# Patient Record
Sex: Female | Born: 2005 | Race: Black or African American | Hispanic: No | Marital: Single | Smoking: Never smoker
Health system: Southern US, Community
[De-identification: ages and names within clinical notes are randomized; demographics above are authoritative.]

## PROBLEM LIST (undated history)

## (undated) DIAGNOSIS — J45909 Unspecified asthma, uncomplicated: Secondary | ICD-10-CM

---
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
## 2023-01-07 ENCOUNTER — Emergency Department
Admission: EM | Admit: 2023-01-07 | Discharge: 2023-01-07 | Disposition: A | Discharge status: Home / Self Care | Payer: Self-pay | Attending: Emergency Medicine | Admitting: Emergency Medicine

## 2023-01-07 ENCOUNTER — Other Ambulatory Visit: Payer: Self-pay

## 2023-01-07 DIAGNOSIS — D72810 Lymphocytopenia: Secondary | ICD-10-CM | POA: Insufficient documentation

## 2023-01-07 DIAGNOSIS — K529 Noninfective gastroenteritis and colitis, unspecified: Secondary | ICD-10-CM | POA: Insufficient documentation

## 2023-01-07 DIAGNOSIS — Z1152 Encounter for screening for COVID-19: Secondary | ICD-10-CM | POA: Insufficient documentation

## 2023-01-07 LAB — CBC
HCT: 40.4 % (ref 36.0–46.0)
Hemoglobin: 12.6 g/dL (ref 12.0–15.0)
MCH: 27.2 pg (ref 26.0–34.0)
MCHC: 31.2 g/dL (ref 30.0–36.0)
MCV: 87.1 fL (ref 80.0–100.0)
Platelets: 346 10*3/uL (ref 150–400)
RBC: 4.64 MIL/uL (ref 3.87–5.11)
RDW: 13.1 % (ref 11.5–15.5)
WBC: 3 10*3/uL — ABNORMAL LOW (ref 4.0–10.5)
nRBC: 0 % (ref 0.0–0.2)

## 2023-01-07 LAB — COMPREHENSIVE METABOLIC PANEL
ALT: 26 U/L (ref 0–44)
AST: 27 U/L (ref 15–41)
Albumin: 4.3 g/dL (ref 3.5–5.0)
Alkaline Phosphatase: 54 U/L (ref 38–126)
Anion gap: 7 (ref 5–15)
BUN: 14 mg/dL (ref 6–20)
CO2: 23 mmol/L (ref 22–32)
Calcium: 9.3 mg/dL (ref 8.9–10.3)
Chloride: 106 mmol/L (ref 98–111)
Creatinine, Ser: 0.61 mg/dL (ref 0.44–1.00)
GFR, Estimated: >60 mL/min (ref >60)
Glucose, Bld: 87 mg/dL (ref 70–99)
Potassium: 3.5 mmol/L (ref 3.5–5.1)
Sodium: 136 mmol/L (ref 135–145)
Total Bilirubin: 0.6 mg/dL (ref 0.3–1.2)
Total Protein: 7.8 g/dL (ref 6.5–8.1)

## 2023-01-07 LAB — URINALYSIS, ROUTINE W REFLEX MICROSCOPIC
Bilirubin Urine: NEGATIVE
Glucose, UA: NEGATIVE mg/dL
Hgb urine dipstick: NEGATIVE
Ketones, ur: 20 mg/dL — AB
Leukocytes,Ua: NEGATIVE
Nitrite: NEGATIVE
Protein, ur: NEGATIVE mg/dL
Specific Gravity, Urine: 1.027 (ref 1.005–1.030)
pH: 5 (ref 5.0–8.0)

## 2023-01-07 LAB — LIPASE, BLOOD: Lipase: 28 U/L (ref 11–51)

## 2023-01-07 LAB — GROUP A STREP BY PCR: Group A Strep by PCR: NOT DETECTED

## 2023-01-07 LAB — RESP PANEL BY RT-PCR (RSV, FLU A&B, COVID)  RVPGX2
Influenza A by PCR: NEGATIVE
Influenza B by PCR: NEGATIVE
Resp Syncytial Virus by PCR: NEGATIVE
SARS Coronavirus 2 by RT PCR: NEGATIVE

## 2023-01-07 LAB — POC URINE PREG, ED: Preg Test, Ur: NEGATIVE

## 2023-01-07 MED ORDER — ONDANSETRON 4 MG PO TBDP: 4.0000 mg | ORAL_TABLET | Freq: Once | ORAL | Status: DC | PRN

## 2023-01-07 MED ORDER — DICYCLOMINE HCL 10 MG PO CAPS: 10.0000 mg | ORAL_CAPSULE | Freq: Three times a day (TID) | ORAL | 0 refills | Status: DC

## 2023-01-07 MED ORDER — ONDANSETRON 4 MG PO TBDP: 4.0000 mg | ORAL_TABLET | Freq: Three times a day (TID) | ORAL | 0 refills | Status: AC | PRN

## 2023-01-07 MED ORDER — DICYCLOMINE HCL 10 MG PO CAPS
10.0000 mg | ORAL_CAPSULE | Freq: Once | ORAL | Status: AC
  Administered 2023-01-07: 10 mg via ORAL
  Filled 2023-01-07: qty 1

## 2023-01-07 MED ORDER — ONDANSETRON HCL 4 MG/2ML IJ SOLN
4.0000 mg | Freq: Once | INTRAMUSCULAR | Status: AC
  Administered 2023-01-07: 4 mg via INTRAVENOUS
  Filled 2023-01-07: qty 2

## 2023-01-07 MED ORDER — SODIUM CHLORIDE 0.9 % IV BOLUS
1000.0000 mL | Freq: Once | INTRAVENOUS | Status: AC
  Administered 2023-01-07: 1000 mL via INTRAVENOUS

## 2023-01-07 NOTE — ED Notes (Signed)
Discharge instructions explained to patient and family at this time. Patient and family state they understand and agree.   

## 2023-01-07 NOTE — Discharge Instructions (Signed)
You can take Zofran every 8 hours for nausea and vomiting.  You can take Bentyl before meals and at bedtime for the next 5 days.

## 2023-01-07 NOTE — ED Provider Notes (Signed)
[facility] Provider Note  Patient Contact: 7:46 PM (approximate)   History   Abdominal Pain   HPI  Tonya Cook is a 17 y.o. female with an unremarkable past medical history, presents to the emergency department with intermittent vomiting and diarrhea over the past 5 days as well as cramping.  Patient denies specific abdominal pain.  No fever.  No chest pain, chest tightness or shortness of breath.      Physical Exam   Triage Vital Signs: ED Triage Vitals [01/07/23 1856]  Enc Vitals Group     BP 129/83     Pulse Rate 88     Resp 18     Temp 97.9 F (36.6 C)     Temp Source Oral     SpO2 100 %     Weight 120 lb (54.4 kg)     Height 5\' 4"  (1.626 m)     Head Circumference      Peak Flow      Pain Score 8     Pain Loc      Pain Edu?      Excl. in [place]?     Most recent vital signs: Vitals:   01/07/23 1856  BP: 129/83  Pulse: 88  Resp: 18  Temp: 97.9 F (36.6 C)  SpO2: 100%    Constitutional: Alert and oriented. Patient is lying supine. Eyes: Conjunctivae are normal. PERRL. EOMI. Head: Atraumatic. ENT:      Ears: Tympanic membranes are mildly injected with mild effusion bilaterally.       Nose: No congestion/rhinnorhea.      Mouth/Throat: Mucous membranes are moist. Posterior pharynx is mildly erythematous.  Hematological/Lymphatic/Immunilogical: No cervical lymphadenopathy.  Cardiovascular: Normal rate, regular rhythm. Normal S1 and S2.  Good peripheral circulation. Respiratory: Normal respiratory effort without tachypnea or retractions. Lungs CTAB. Good air entry to the bases with no decreased or absent breath sounds. Gastrointestinal: Bowel sounds 4 quadrants. Soft and nontender to palpation. No guarding or rigidity. No palpable masses. No distention. No CVA tenderness. Musculoskeletal: Full range of motion to all extremities. No gross deformities appreciated. Neurologic:  Normal speech and language. No gross focal neurologic  deficits are appreciated.  Skin:  Skin is warm, dry and intact. No rash noted. Psychiatric: Mood and affect are normal. Speech and behavior are normal. Patient exhibits appropriate insight and judgement.   ED Results / Procedures / Treatments   Labs (all labs ordered are listed, but only abnormal results are displayed) Labs Reviewed  CBC - Abnormal; Notable for the following components:      Result Value   WBC 3.0 (*)    All other components within normal limits  URINALYSIS, ROUTINE W REFLEX MICROSCOPIC - Abnormal; Notable for the following components:   Color, Urine YELLOW (*)    APPearance HAZY (*)    Ketones, ur 20 (*)    All other components within normal limits  GROUP A STREP BY PCR  RESP PANEL BY RT-PCR (RSV, FLU A&B, COVID)  RVPGX2  LIPASE, BLOOD  COMPREHENSIVE METABOLIC PANEL  POC URINE PREG, ED          PROCEDURES:  Critical Care performed: No  Procedures   MEDICATIONS ORDERED IN ED: Medications  ondansetron (ZOFRAN-ODT) disintegrating tablet 4 mg (has no administration in time range)  sodium chloride 0.9 % bolus 1,000 mL (1,000 mLs Intravenous New Bag/Given 01/07/23 2002)  ondansetron (ZOFRAN) injection 4 mg (4 mg Intravenous Given 01/07/23 2002)  dicyclomine (BENTYL) capsule 10  mg (10 mg Oral Given 01/07/23 1955)     IMPRESSION / MDM / ASSESSMENT AND PLAN / ED COURSE  I reviewed the triage vital signs and the nursing notes.                              Assessment and plan: Vomiting: Diarrhea:   17 year old female presents to the emergency department with vomiting and diarrhea that has occurred on and off for the past 5 days.  Vital signs are reassuring at triage.  On exam, patient was alert and nontoxic-appearing.  Urine pregnancy test was negative.  Group A strep testing was negative.  COVID, and flu and RSV testing was negative.  Urinalysis shows no signs of UTI.  CBC indicates lymphopenia.  CMP within reference range.  Suspect unspecified  gastroenteritis.  Patient was given normal saline bolus, Zofran and Bentyl in the emergency department and felt overall improved.  She was discharged with Zofran and Bentyl.  Return precautions were given to return with new or worsening symptoms.   FINAL CLINICAL IMPRESSION(S) / ED DIAGNOSES   Final diagnoses:  Noninfectious gastroenteritis, unspecified type     Rx / DC Orders   ED Discharge Orders          Ordered    ondansetron (ZOFRAN-ODT) 4 MG disintegrating tablet  Every 8 hours PRN        01/07/23 2116    dicyclomine (BENTYL) 10 MG capsule  3 times daily before meals & bedtime        01/07/23 2116             Note:  This document was prepared using Dragon voice recognition software and may include unintentional dictation errors.   Vallarie Mare Startex, Hershal Coria 01/07/23 2120    Harvest Dark, MD 01/07/23 2245

## 2023-01-07 NOTE — ED Triage Notes (Signed)
Pt with c/o generalized abdominal pain x 2 weeks. Pt with c/o N, V,D, denies urinary symptoms. Pt states symptoms come and go.

## 2024-05-03 NOTE — Progress Notes (Deleted)
 GYNECOLOGY ANNUAL PHYSICAL EXAM PROGRESS NOTE  Subjective:    Tonya Cook is a 18 y.o. No obstetric history on file. female who presents for an annual exam.  The patient {is/is not/has never been:13135} sexually active. The patient participates in regular exercise: {yes/no/not asked:9010}. Has the patient ever been transfused or tattooed?: {yes/no/not asked:9010}. The patient reports that there {is/is not:9024} domestic violence in her life.   The patient has the following complaints today:   Menstrual History: Menarche age: *** No LMP recorded.     Gynecologic History:  Contraception: {method:5051} History of STI's:  Last Pap: Not age appropriate Last mammogram: Not age appropriate      OB History  No obstetric history on file.    No past medical history on file.  No past surgical history on file.  No family history on file.  Social History   Socioeconomic History   Marital status: Single    Spouse name: Not on file   Number of children: Not on file   Years of education: Not on file   Highest education level: Not on file  Occupational History   Not on file  Tobacco Use   Smoking status: Never   Smokeless tobacco: Never  Substance and Sexual Activity   Alcohol use: Not on file   Drug use: Not on file   Sexual activity: Not on file  Other Topics Concern   Not on file  Social History Narrative   Not on file   Social Drivers of Health   Financial Resource Strain: Not on file  Food Insecurity: Not on file  Transportation Needs: Not on file  Physical Activity: Not on file  Stress: Not on file  Social Connections: Not on file  Intimate Partner Violence: Not on file    Current Outpatient Medications on File Prior to Visit  Medication Sig Dispense Refill   dicyclomine  (BENTYL ) 10 MG capsule Take 1 capsule (10 mg total) by mouth 4 (four) times daily -  before meals and at bedtime for 5 days. 20 capsule 0   No current facility-administered  medications on file prior to visit.    No Known Allergies   Review of Systems Constitutional: negative for chills, fatigue, fevers and sweats Eyes: negative for irritation, redness and visual disturbance Ears, nose, mouth, throat, and face: negative for hearing loss, nasal congestion, snoring and tinnitus Respiratory: negative for asthma, cough, sputum Cardiovascular: negative for chest pain, dyspnea, exertional chest pressure/discomfort, irregular heart beat, palpitations and syncope Gastrointestinal: negative for abdominal pain, change in bowel habits, nausea and vomiting Genitourinary: negative for abnormal menstrual periods, genital lesions, sexual problems and vaginal discharge, dysuria and urinary incontinence Integument/breast: negative for breast lump, breast tenderness and nipple discharge Hematologic/lymphatic: negative for bleeding and easy bruising Musculoskeletal:negative for back pain and muscle weakness Neurological: negative for dizziness, headaches, vertigo and weakness Endocrine: negative for diabetic symptoms including polydipsia, polyuria and skin dryness Allergic/Immunologic: negative for hay fever and urticaria      Objective:  There were no vitals taken for this visit. There is no height or weight on file to calculate BMI.    General Appearance:    Alert, cooperative, no distress, appears stated age  Head:    Normocephalic, without obvious abnormality, atraumatic  Eyes:    PERRL, conjunctiva/corneas clear, EOM's intact, both eyes  Ears:    Normal external ear canals, both ears  Nose:   Nares normal, septum midline, mucosa normal, no drainage or sinus tenderness  Throat:  Lips, mucosa, and tongue normal; teeth and gums normal  Neck:   Supple, symmetrical, trachea midline, no adenopathy; thyroid: no enlargement/tenderness/nodules; no carotid bruit or JVD  Back:     Symmetric, no curvature, ROM normal, no CVA tenderness  Lungs:     Clear to auscultation  bilaterally, respirations unlabored  Chest Wall:    No tenderness or deformity   Heart:    Regular rate and rhythm, S1 and S2 normal, no murmur, rub or gallop  Breast Exam:    No tenderness, masses, or nipple abnormality  Abdomen:     Soft, non-tender, bowel sounds active all four quadrants, no masses, no organomegaly.    Genitalia:    Pelvic:external genitalia normal, vagina without lesions, discharge, or tenderness, rectovaginal septum  normal. Cervix normal in appearance, no cervical motion tenderness, no adnexal masses or tenderness.  Uterus normal size, shape, mobile, regular contours, nontender.  Rectal:    Normal external sphincter.  No hemorrhoids appreciated. Internal exam not done.   Extremities:   Extremities normal, atraumatic, no cyanosis or edema  Pulses:   2+ and symmetric all extremities  Skin:   Skin color, texture, turgor normal, no rashes or lesions  Lymph nodes:   Cervical, supraclavicular, and axillary nodes normal  Neurologic:   CNII-XII intact, normal strength, sensation and reflexes throughout   .  Labs:  Lab Results  Component Value Date   WBC 3.0 (L) 01/07/2023   HGB 12.6 01/07/2023   HCT 40.4 01/07/2023   MCV 87.1 01/07/2023   PLT 346 01/07/2023    Lab Results  Component Value Date   CREATININE 0.61 01/07/2023   BUN 14 01/07/2023   NA 136 01/07/2023   K 3.5 01/07/2023   CL 106 01/07/2023   CO2 23 01/07/2023    Lab Results  Component Value Date   ALT 26 01/07/2023   AST 27 01/07/2023   ALKPHOS 54 01/07/2023   BILITOT 0.6 01/07/2023    No results found for: "TSH"   Assessment:   No diagnosis found.   Plan:  Blood tests: {blood tests:13147}. Breast self exam technique reviewed and patient encouraged to perform self-exam monthly. Contraception: {contraceptive methods:5051}. Discussed healthy lifestyle modifications. Mammogram {discussed/ordered:14545} Pap smear {discussed/ordered:14545}. Flu vaccine: Follow up in 1 year for annual  exam   Lendon Queen, CMA [facility]

## 2024-05-03 NOTE — Patient Instructions (Incomplete)
Preventive Care 50-20 Years Old, Female Preventive care refers to lifestyle choices and visits with your health care provider that can promote health and wellness. At this stage in your life, you may start seeing a primary care physician instead of a pediatrician for your preventive care. Preventive care visits are also called wellness exams. What can I expect for my preventive care visit? Counseling During your preventive care visit, your health care provider may ask about your: Medical history, including: Past medical problems. Family medical history. Pregnancy history. Current health, including: Menstrual cycle. Method of birth control. Emotional well-being. Home life and relationship well-being. Sexual activity and sexual health. Lifestyle, including: Alcohol, nicotine or tobacco, and drug use. Access to firearms. Diet, exercise, and sleep habits. Sunscreen use. Motor vehicle safety. Physical exam Your health care provider may check your: Height and weight. These may be used to calculate your BMI (body mass index). BMI is a measurement that tells if you are at a healthy weight. Waist circumference. This measures the distance around your waistline. This measurement also tells if you are at a healthy weight and may help predict your risk of certain diseases, such as type 2 diabetes and high blood pressure. Heart rate and blood pressure. Body temperature. Skin for abnormal spots. Breasts. What immunizations do I need?  Vaccines are usually given at various ages, according to a schedule. Your health care provider will recommend vaccines for you based on your age, medical history, and lifestyle or other factors, such as travel or where you work. What tests do I need? Screening Your health care provider may recommend screening tests for certain conditions. This may include: Vision and hearing tests. Lipid and cholesterol levels. Pelvic exam and Pap test. Hepatitis B  test. Hepatitis C test. HIV (human immunodeficiency virus) test. STI (sexually transmitted infection) testing, if you are at risk. Tuberculosis skin test if you have symptoms. BRCA-related cancer screening. This may be done if you have a family history of breast, ovarian, tubal, or peritoneal cancers. Talk with your health care provider about your test results, treatment options, and if necessary, the need for more tests. Follow these instructions at home: Eating and drinking  Eat a healthy diet that includes fresh fruits and vegetables, whole grains, lean protein, and low-fat dairy products. Drink enough fluid to keep your urine pale yellow. Do not drink alcohol if: Your health care provider tells you not to drink. You are pregnant, may be pregnant, or are planning to become pregnant. You are under the legal drinking age. In the [place] the legal drinking age is 67. If you drink alcohol: Limit how much you have to 0-1 drink a day. Know how much alcohol is in your drink. In the [place] one drink equals one 12 oz bottle of beer (355 mL), one 5 oz glass of wine (148 mL), or one 1 oz glass of hard liquor (44 mL). Lifestyle Brush your teeth every morning and night with fluoride toothpaste. Floss one time each day. Exercise for at least 30 minutes 5 or more days of the week. Do not use any products that contain nicotine or tobacco. These products include cigarettes, chewing tobacco, and vaping devices, such as e-cigarettes. If you need help quitting, ask your health care provider. Do not use drugs. If you are sexually active, practice safe sex. Use a condom or other form of protection to prevent STIs. If you do not wish to become pregnant, use a form of birth control. If you plan to become pregnant,  see your health care provider for a prepregnancy visit. Find healthy ways to manage stress, such as: Meditation, yoga, or listening to music. Journaling. Talking to a trusted person. Spending time  with friends and family. Safety Always wear your seat belt while driving or riding in a vehicle. Do not drive: If you have been drinking alcohol. Do not ride with someone who has been drinking. When you are tired or distracted. While texting. If you have been using any mind-altering substances or drugs. Wear a helmet and other protective equipment during sports activities. If you have firearms in your house, make sure you follow all gun safety procedures. Seek help if you have been bullied, physically abused, or sexually abused. Use the internet responsibly to avoid dangers, such as online bullying and online sex predators. What's next? Go to your health care provider once a year for an annual wellness visit. Ask your health care provider how often you should have your eyes and teeth checked. Stay up to date on all vaccines. This information is not intended to replace advice given to you by your health care provider. Make sure you discuss any questions you have with your health care provider. Document Revised: 05/14/2021 Document Reviewed: 05/14/2021 Elsevier Patient Education  2024 [employer] Breast Self-Awareness Breast self-awareness is knowing how your breasts look and feel. You need to: Check your breasts on a regular basis. Tell your doctor about any changes. Become familiar with the look and feel of your breasts. This can help you catch a breast problem while it is still small and can be treated. You should do breast self-exams even if you have breast implants. What you need: A mirror. A well-lit room. A pillow or other soft object. How to do a breast self-exam Follow these steps to do a breast self-exam: Look for changes  Take off all the clothes above your waist. Stand in front of a mirror in a room with good lighting. Put your hands down at your sides. Compare your breasts in the mirror. Look for any difference between them, such as: A difference in shape. A  difference in size. Wrinkles, dips, and bumps in one breast and not the other. Look at each breast for changes in the skin, such as: Redness. Scaly areas. Skin that has gotten thicker. Dimpling. Open sores (ulcers). Look for changes in your nipples, such as: Fluid coming out of a nipple. Fluid around a nipple. Bleeding. Dimpling. Redness. A nipple that looks pushed in (retracted), or that has changed position. Feel for changes Lie on your back. Feel each breast. To do this: Pick a breast to feel. Place a pillow under the shoulder closest to that breast. Put the arm closest to that breast behind your head. Feel the nipple area of that breast using the hand of your other arm. Feel the area with the pads of your three middle fingers by making small circles with your fingers. Use light, medium, and firm pressure. Continue the overlapping circles, moving downward over the breast. Keep making circles with your fingers. Stop when you feel your ribs. Start making circles with your fingers again, this time going upward until you reach your collarbone. Then, make circles outward across your breast and into your armpit area. Squeeze your nipple. Check for discharge and lumps. Repeat these steps to check your other breast. Sit or stand in the tub or shower. With soapy water on your skin, feel each breast the same way you did when you were lying down.  Write down what you find Writing down what you find can help you remember what to tell your doctor. Write down: What is normal for each breast. Any changes you find in each breast. These include: The kind of changes you find. A tender or painful breast. Any lump you find. Write down its size and where it is. When you last had your monthly period (menstrual cycle). General tips If you are breastfeeding, the best time to check your breasts is after you feed your baby or after you use a breast pump. If you get monthly bleeding, the best time to  check your breasts is 5-7 days after your monthly cycle ends. With time, you will become comfortable with the self-exam. You will also start to know if there are changes in your breasts. Contact a doctor if: You see a change in the shape or size of your breasts or nipples. You see a change in the skin of your breast or nipples, such as red or scaly skin. You have fluid coming from your nipples that is not normal. You find a new lump or thick area. You have breast pain. You have any concerns about your breast health. Summary Breast self-awareness includes looking for changes in your breasts and feeling for changes within your breasts. You should do breast self-awareness in front of a mirror in a well-lit room. If you get monthly periods (menstrual cycles), the best time to check your breasts is 5-7 days after your period ends. Tell your doctor about any changes you see in your breasts. Changes include changes in size, changes on the skin, painful or tender breasts, or fluid from your nipples that is not normal. This information is not intended to replace advice given to you by your health care provider. Make sure you discuss any questions you have with your health care provider. Document Revised: 04/23/2022 Document Reviewed: 09/18/2021 Elsevier Patient Education  2024 [employer].

## 2024-05-04 ENCOUNTER — Encounter: Payer: Self-pay | Admitting: Certified Nurse Midwife

## 2024-05-04 DIAGNOSIS — Z1159 Encounter for screening for other viral diseases: Secondary | ICD-10-CM

## 2024-05-04 DIAGNOSIS — Z114 Encounter for screening for human immunodeficiency virus [HIV]: Secondary | ICD-10-CM

## 2024-05-04 DIAGNOSIS — Z01419 Encounter for gynecological examination (general) (routine) without abnormal findings: Secondary | ICD-10-CM

## 2024-05-08 ENCOUNTER — Encounter: Payer: Self-pay | Admitting: Certified Nurse Midwife

## 2024-07-19 ENCOUNTER — Encounter: Payer: Self-pay | Admitting: Family Medicine

## 2024-07-19 ENCOUNTER — Ambulatory Visit: Payer: Self-pay | Admitting: Family Medicine

## 2024-07-19 VITALS — BP 105/58 | HR 71 | Resp 16 | Ht 64.0 in | Wt 120.0 lb

## 2024-07-19 DIAGNOSIS — N946 Dysmenorrhea, unspecified: Secondary | ICD-10-CM | POA: Insufficient documentation

## 2024-07-19 DIAGNOSIS — Z793 Long term (current) use of hormonal contraceptives: Secondary | ICD-10-CM

## 2024-07-19 DIAGNOSIS — F4323 Adjustment disorder with mixed anxiety and depressed mood: Secondary | ICD-10-CM | POA: Diagnosis not present

## 2024-07-19 DIAGNOSIS — F5102 Adjustment insomnia: Secondary | ICD-10-CM | POA: Insufficient documentation

## 2024-07-19 DIAGNOSIS — J4520 Mild intermittent asthma, uncomplicated: Secondary | ICD-10-CM

## 2024-07-19 LAB — POCT URINE PREGNANCY: Preg Test, Ur: NEGATIVE

## 2024-07-19 MED ORDER — HYDROXYZINE PAMOATE 25 MG PO CAPS: 25.0000 mg | ORAL_CAPSULE | Freq: Every evening | ORAL | 2 refills | Status: AC | PRN

## 2024-07-19 MED ORDER — NORETHIN ACE-ETH ESTRAD-FE 1-20 MG-MCG PO TABS: 1.0000 | ORAL_TABLET | Freq: Every day | ORAL | 3 refills | Status: DC

## 2024-07-19 MED ORDER — AIRSUPRA 90-80 MCG/ACT IN AERO: 2.0000 | INHALATION_SPRAY | RESPIRATORY_TRACT | 3 refills | Status: AC | PRN

## 2024-07-19 NOTE — Progress Notes (Signed)
 New patient visit   Patient: Tonya Cook   DOB: 10-Dec-2005   18 y.o. Female  MRN: [id number] Visit Date: 07/19/2024  Today's healthcare provider: Rockie Agent, MD   Chief Complaint  Patient presents with   New Patient (Initial Visit)    NP/Est Care pt wants to discuss BC( Pill)    Subjective    Tonya Cook is a 18 y.o. female who presents today as a new patient to establish care.   HPI     New Patient (Initial Visit)    Additional comments: NP/Est Care pt wants to discuss BC( Pill)       Last edited by Marylen Odella CROME, CMA on 07/19/2024 10:38 AM.       Discussed the use of AI scribe software for clinical note transcription with the patient, who gave verbal consent to proceed.  History of Present Illness Tonya Cook is a 18 year old female who presents to establish care and discuss starting oral contraception.  She is interested in starting oral contraception to manage painful and heavy periods, hormonal acne, and low energy levels associated with her menstrual cycle. Her periods are regular but have always been painful. She has never been on birth control pills before but is open to starting Loestrin.  She has a history of mild, chronic asthma, which was more severe during her childhood. Currently, her asthma symptoms are more seasonal, particularly in the spring. She last used her albuterol inhaler during the spring and does not have enough albuterol. She is not currently using any other asthma medications.  She has a history of atopic rhinitis, experiencing frequent skin rashes, particularly in the summer, for which she was previously prescribed steroids. She is not currently taking any medication for this condition.  She has been experiencing symptoms of depression and anxiety, including trouble sleeping, lack of motivation, and low energy. Her mother has a history of depression and anxiety. She has not been diagnosed with depression or anxiety before  and has not worked with a Environmental consultant.  She is sexually active with males and uses barrier contraception consistently. 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 She lives with her mother and has two younger and two older siblings.      Past Medical History:  Diagnosis Date   Iliotibial band syndrome 07/29/2015    Outpatient Medications Prior to Visit  Medication Sig   albuterol (VENTOLIN HFA) 108 (90 Base) MCG/ACT inhaler Inhale 2 puffs into the lungs.   [DISCONTINUED] norethindrone-ethinyl estradiol-FE (LOESTRIN FE) 1-20 MG-MCG tablet Take 1 tablet by mouth.   [DISCONTINUED] dicyclomine  (BENTYL ) 10 MG capsule Take 1 capsule (10 mg total) by mouth 4 (four) times daily -  before meals and at bedtime for 5 days. (Patient not taking: Reported on 07/19/2024)   No facility-administered medications prior to visit.    History reviewed. No pertinent surgical history. No family status information on file.   History reviewed. No pertinent family history. Social History   Socioeconomic History   Marital status: Single    Spouse name: Not on file   Number of children: Not on file   Years of education: Not on file   Highest education level: Not on file  Occupational History   Not on file  Tobacco Use   Smoking status: Never   Smokeless tobacco: Never  Substance and Sexual  Activity   Alcohol use: Not on file   Drug use: Not on file   Sexual activity: Not on file  Other Topics Concern   Not on file  Social History Narrative   Not on file   Social Drivers of Health   Financial Resource Strain: Not on file  Food Insecurity: Not on file  Transportation Needs: Not on file  Physical Activity: Not on file  Stress: Not on file  Social Connections: Not on file     No Known Allergies   There is no immunization history on file for this  patient.  Health Maintenance  Topic Date Due   HPV VACCINES (1 - 3-dose series) Never done   HIV Screening  Never done   Meningococcal B Vaccine (1 of 2 - Standard) Never done   Hepatitis C Screening  Never done   DTaP/Tdap/Td (1 - Tdap) Never done   Pneumococcal Vaccine (1 of 2 - PCV) 12/11/2022   COVID-19 Vaccine (3 - 2024-25 season) 08/01/2023   INFLUENZA VACCINE  06/30/2024   Hepatitis B Vaccines 19-59 Average Risk  Completed    Patient Care Team: Sharma Coyer, MD as PCP - General (Family Medicine)  Review of Systems  Last CBC Lab Results  Component Value Date   WBC 3.0 (L) 01/07/2023   HGB 12.6 01/07/2023   HCT 40.4 01/07/2023   MCV 87.1 01/07/2023   MCH 27.2 01/07/2023   RDW 13.1 01/07/2023   PLT 346 01/07/2023   Last metabolic panel Lab Results  Component Value Date   GLUCOSE 87 01/07/2023   NA 136 01/07/2023   K 3.5 01/07/2023   CL 106 01/07/2023   CO2 23 01/07/2023   BUN 14 01/07/2023   CREATININE 0.61 01/07/2023   GFRNONAA >60 01/07/2023   CALCIUM 9.3 01/07/2023   PROT 7.8 01/07/2023   ALBUMIN 4.3 01/07/2023   BILITOT 0.6 01/07/2023   ALKPHOS 54 01/07/2023   AST 27 01/07/2023   ALT 26 01/07/2023   ANIONGAP 7 01/07/2023   Last lipids No results found for: CHOL, HDL, LDLCALC, LDLDIRECT, TRIG, CHOLHDL Last hemoglobin A1c No results found for: HGBA1C Last thyroid functions No results found for: TSH, T3TOTAL, T4TOTAL, THYROIDAB Last vitamin D No results found for: 25OHVITD2, 25OHVITD3, VD25OH Last vitamin B12 and Folate No results found for: VITAMINB12, FOLATE   Flowsheet Row Office Visit from 07/19/2024 in [facility]  PHQ-9 Total Score 15      07/19/2024   10:47 AM  GAD 7 : Generalized Anxiety Score  Nervous, Anxious, on Edge 1  Control/stop worrying 1  Worry too much - different things 2  Trouble relaxing 3  Restless 1  Easily annoyed or irritable 2  Afraid - awful  might happen 2  Total GAD 7 Score 12  Anxiety Difficulty Very difficult        Objective    BP (!) 105/58 (BP Location: Right Arm, Patient Position: Sitting, Cuff Size: Small)   Pulse 71   Resp 16   Ht 5' 4 (1.626 m)   Wt 120 lb (54.4 kg)   SpO2 100%   BMI 20.60 kg/m  BP Readings from Last 3 Encounters:  07/19/24 (!) 105/58  01/07/23 122/80   Wt Readings from Last 3 Encounters:  07/19/24 120 lb (54.4 kg)  01/07/23 120 lb (54.4 kg) (37%, Z= -0.34)*   * Growth percentiles are based on CDC (Girls, 2-20 Years) data.        Depression Screen  07/19/2024   10:46 AM  PHQ 2/9 Scores  PHQ - 2 Score 2  PHQ- 9 Score 15   No results found for any visits on 07/19/24.   Physical Exam Vitals reviewed.  Constitutional:      General: She is not in acute distress.    Appearance: Normal appearance. She is normal weight. She is not ill-appearing, toxic-appearing or diaphoretic.     Comments: Well groomed, calmly sitting   Cardiovascular:     Rate and Rhythm: Normal rate and regular rhythm.  Pulmonary:     Effort: Pulmonary effort is normal. No respiratory distress.     Breath sounds: No wheezing, rhonchi or rales.  Neurological:     Mental Status: She is alert and oriented to person, place, and time.  Psychiatric:        Attention and Perception: Attention and perception normal. She is attentive. She does not perceive auditory or visual hallucinations.        Mood and Affect: Mood and affect normal.        Speech: Speech normal.        Behavior: Behavior normal. Behavior is cooperative.        Thought Content: Thought content normal. Thought content is not paranoid or delusional. Thought content does not include homicidal or suicidal ideation. Thought content does not include homicidal or suicidal plan.        Judgment: Judgment normal.    Physical Exam        Assessment & Plan      Problem List Items Addressed This Visit       Respiratory   Asthma -  Primary   Asthma, mild, chronic, uncomplicated Asthma is mild, chronic, and uncomplicated, with symptoms primarily during the spring. Previously used a nebulizer, currently uses an albuterol inhaler but does not have enough albuterol at present. Airsupra , containing albuterol and a steroid, is recommended for dual action to dilate airways and reduce inflammation. - Prescribe Airsupra  (albuterol and steroid) 90-80 micrograms, two puffs as needed, no more than six doses per day - Provide coupon code for Airsupra  - Instruct to rinse mouth after use due to steroid content      Relevant Medications   albuterol (VENTOLIN HFA) 108 (90 Base) MCG/ACT inhaler   Albuterol-Budesonide (AIRSUPRA ) 90-80 MCG/ACT AERO     Genitourinary   Dysmenorrhea treated with oral contraceptive   Initiation of oral contraception for dysmenorrhea and hormonal acne Interested in starting oral contraception primarily for dysmenorrhea and hormonal acne. Periods are regular but painful and heavy, with hormonal acne and low energy around her menstrual cycle. Has not been on birth control pills before. Chosen oral contraceptive contains methenamine, ethanol estradiol, and iron, which may help alleviate her symptoms. Discussed potential side effects including risk of blood clots, mood changes, nausea, and weight gain. - Perform pregnancy test - Prescribe Loestrin (methenamine, ethanol estradiol, and iron) - Advise to take ibuprofen 400-800 mg every 4-6 hours as needed for cramps      Relevant Medications   norethindrone-ethinyl estradiol-FE (LOESTRIN FE) 1-20 MG-MCG tablet   Other Relevant Orders   POCT urine pregnancy     Other   Adjustment insomnia   Chronic  Associated with mixed anxiety and depression  Trial of hydroxyzine  25mg  PRN at bedtime       Relevant Medications   hydrOXYzine  (VISTARIL ) 25 MG capsule   Adjustment disorder with mixed anxiety and depressed mood   Depressed mood and adjustment disorder with  anxiety with  associated insomnia Chronic  Elevated depression and anxiety screening scores. Reports trouble sleeping, lack of motivation, and passive suicidal ideation. Family history of depression and anxiety. Interested in therapy but hesitant to start. Not ready for medication for depression or anxiety at this time. Discussed the importance of therapy and provided resources for finding a therapist. Offered medication for sleep. - Encourage establishing care with a therapist, using psychologytoday.com - Provide information for development of active suidal ideations with plan - Prescribe hydroxyzine  25 mg at bedtime as needed for sleep - Schedule follow-up appointment in six weeks to assess anxiety and depression        Assessment & Plan        Return in about 6 weeks (around 08/30/2024) for Anxiety, Depression.      Rockie Agent, MD  [facility] [phone] (phone) [phone] (fax)  [facility]

## 2024-07-19 NOTE — Assessment & Plan Note (Signed)
 Initiation of oral contraception for dysmenorrhea and hormonal acne Interested in starting oral contraception primarily for dysmenorrhea and hormonal acne. Periods are regular but painful and heavy, with hormonal acne and low energy around her menstrual cycle. Has not been on birth control pills before. Chosen oral contraceptive contains methenamine, ethanol estradiol, and iron, which may help alleviate her symptoms. Discussed potential side effects including risk of blood clots, mood changes, nausea, and weight gain. - Perform pregnancy test - Prescribe Loestrin (methenamine, ethanol estradiol, and iron) - Advise to take ibuprofen 400-800 mg every 4-6 hours as needed for cramps

## 2024-07-19 NOTE — Assessment & Plan Note (Addendum)
 Depressed mood and adjustment disorder with anxiety with associated insomnia Chronic  Elevated depression and anxiety screening scores. Reports trouble sleeping, lack of motivation, and passive suicidal ideation. Family history of depression and anxiety. Interested in therapy but hesitant to start. Not ready for medication for depression or anxiety at this time. Discussed the importance of therapy and provided resources for finding a therapist. Offered medication for sleep. - Encourage establishing care with a therapist, using psychologytoday.com - Provide information for development of active suidal ideations with plan - Prescribe hydroxyzine  25 mg at bedtime as needed for sleep - Schedule follow-up appointment in six weeks to assess anxiety and depression

## 2024-07-19 NOTE — Patient Instructions (Addendum)
   If you are feeling suicidal or depression symptoms worsen please immediately go to:   If you are thinking about harming yourself or having thoughts of suicide, or if you know someone who is, seek help right away. If you are in crisis, make sure you are not left alone.  If someone else is in crisis, make sure he/she/they is not left alone  Call 988 OR 1-800-273-TALK     Other crisis resources:  [facility] [employer] (Domestic Violence, Rape & Victim Assistance [phone]  [facility] (24/7)   [phone]  [place]  National Suicide Hotline   985-732-9773 (TALK)                             Contains text generated by [employer].                                 Contains text generated by [employer].

## 2024-07-19 NOTE — Assessment & Plan Note (Signed)
 Asthma, mild, chronic, uncomplicated Asthma is mild, chronic, and uncomplicated, with symptoms primarily during the spring. Previously used a nebulizer, currently uses an albuterol inhaler but does not have enough albuterol at present. Airsupra , containing albuterol and a steroid, is recommended for dual action to dilate airways and reduce inflammation. - Prescribe Airsupra  (albuterol and steroid) 90-80 micrograms, two puffs as needed, no more than six doses per day - Provide coupon code for Airsupra  - Instruct to rinse mouth after use due to steroid content

## 2024-07-19 NOTE — Assessment & Plan Note (Signed)
 Chronic  Associated with mixed anxiety and depression  Trial of hydroxyzine  25mg  PRN at bedtime

## 2024-08-11 ENCOUNTER — Ambulatory Visit
Admission: EM | Admit: 2024-08-11 | Discharge: 2024-08-11 | Disposition: A | Discharge status: Home / Self Care | Attending: Emergency Medicine | Admitting: Emergency Medicine

## 2024-08-11 ENCOUNTER — Encounter: Payer: Self-pay | Admitting: Emergency Medicine

## 2024-08-11 DIAGNOSIS — R300 Dysuria: Secondary | ICD-10-CM | POA: Insufficient documentation

## 2024-08-11 HISTORY — DX: Unspecified asthma, uncomplicated: J45.909

## 2024-08-11 LAB — POCT URINE DIPSTICK
Bilirubin, UA: NEGATIVE
Glucose, UA: NEGATIVE mg/dL
Ketones, POC UA: NEGATIVE mg/dL
Nitrite, UA: NEGATIVE
POC PROTEIN,UA: >=300 — AB
Spec Grav, UA: 1.025 (ref 1.010–1.025)
Urobilinogen, UA: 0.2 U/dL
pH, UA: 7 (ref 5.0–8.0)

## 2024-08-11 MED ORDER — NITROFURANTOIN MONOHYD MACRO 100 MG PO CAPS: 100.0000 mg | ORAL_CAPSULE | Freq: Two times a day (BID) | ORAL | 0 refills | Status: AC

## 2024-08-11 NOTE — ED Triage Notes (Signed)
 Patient complains painful urination, urinary frequency and blood in urine x 1 day. Patient also complains of abdominal pain x 1 day. Patient took Ibuprofen last night for pain with no relief. Rates pain 2/10.

## 2024-08-11 NOTE — Discharge Instructions (Addendum)
 Your urinalysis shows Tonya Cook blood cells but does not show bacteria your urine will be sent to the lab to determine exactly which bacteria is present, if any changes need to be made to your medications you will be notified  Begin use of macrobid  twice daily for 5 days  You may use over-the-counter Azo to help minimize your symptoms until antibiotic removes bacteria, this medication will turn your urine orange  Increase your fluid intake through use of water  As always practice good hygiene, wiping front to back and avoidance of scented vaginal products to prevent further irritation  If symptoms continue to persist after use of medication or recur please follow-up with urgent care or your primary doctor as needed

## 2024-08-11 NOTE — ED Provider Notes (Signed)
 Tonya Cook    CSN: [id number] Arrival date & time: 08/11/24  1312      History   Chief Complaint Chief Complaint  Patient presents with   Abdominal Pain   Urinary Frequency   Hematuria    HPI Tonya Cook is a 18 y.o. female.   Patient presents for evaluation of dysuria, urinary frequency and hematuria beginning 1 day ago.  Associated lower abdominal pressure and right sided low back pain.  Attempted use of ibuprofen.  Denies fever, vaginal symptoms.  Last menstrual period 07/11/2024, recently started birth control pills.  Past Medical History:  Diagnosis Date   Asthma    Iliotibial band syndrome 07/29/2015    Patient Active Problem List   Diagnosis Date Noted   Adjustment insomnia 07/19/2024   Adjustment disorder with mixed anxiety and depressed mood 07/19/2024   Dysmenorrhea treated with oral contraceptive 07/19/2024   Pes planus 07/29/2015   Asthma 06/18/2010   Atopic rhinitis 06/08/2008    History reviewed. No pertinent surgical history.  OB History   No obstetric history on file.      Home Medications    Prior to Admission medications   Medication Sig Start Date End Date Taking? Authorizing Provider  nitrofurantoin , macrocrystal-monohydrate, (MACROBID ) 100 MG capsule Take 1 capsule (100 mg total) by mouth 2 (two) times daily. 08/11/24  Yes Tonya Kayes R, NP  albuterol (VENTOLIN HFA) 108 (90 Base) MCG/ACT inhaler Inhale 2 puffs into the lungs. 08/29/20   [provider]  Albuterol-Budesonide (AIRSUPRA ) 90-80 MCG/ACT AERO Inhale 2 puffs into the lungs as needed. 07/19/24   Simmons-Robinson, Rockie, MD  hydrOXYzine  (VISTARIL ) 25 MG capsule Take 1 capsule (25 mg total) by mouth at bedtime as needed. 07/19/24   Simmons-Robinson, Makiera, MD  norethindrone-ethinyl estradiol-FE (LOESTRIN FE) 1-20 MG-MCG tablet Take 1 tablet by mouth daily. 07/19/24   Simmons-Robinson, Rockie, MD    Family History History reviewed. No pertinent family  history.  Social History Social History   Tobacco Use   Smoking status: Never   Smokeless tobacco: Never  Substance Use Topics   Alcohol use: Yes   Drug use: Not Currently    Types: Marijuana     Allergies   Patient has no known allergies.   Review of Systems Review of Systems   Physical Exam Triage Vital Signs ED Triage Vitals  Encounter Vitals Group     BP 08/11/24 1413 114/73     Girls Systolic BP Percentile --      Girls Diastolic BP Percentile --      Boys Systolic BP Percentile --      Boys Diastolic BP Percentile --      Pulse Rate 08/11/24 1413 81     Resp 08/11/24 1413 18     Temp 08/11/24 1413 98.6 F (37 C)     Temp Source 08/11/24 1413 Oral     SpO2 08/11/24 1413 99 %     Weight --      Height --      Head Circumference --      Peak Flow --      Pain Score 08/11/24 1409 2     Pain Loc --      Pain Education --      Exclude from Growth Chart --    No data found.  Updated Vital Signs BP 114/73 (BP Location: Right Arm)   Pulse 81   Temp 98.6 F (37 C) (Oral)   Resp 18   LMP  07/11/2024 (Approximate)   SpO2 99%   Visual Acuity Right Eye Distance:   Left Eye Distance:   Bilateral Distance:    Right Eye Near:   Left Eye Near:    Bilateral Near:     Physical Exam Constitutional:      Appearance: Normal appearance.  Eyes:     Extraocular Movements: Extraocular movements intact.  Pulmonary:     Effort: Pulmonary effort is normal.  Abdominal:     Tenderness: There is abdominal tenderness in the suprapubic area. There is no right CVA tenderness or left CVA tenderness.  Neurological:     Mental Status: She is alert and oriented to person, place, and time. Mental status is at baseline.      UC Treatments / Results  Labs (all labs ordered are listed, but only abnormal results are displayed) Labs Reviewed  URINE CULTURE  POCT URINE DIPSTICK    EKG   Radiology No results found.  Procedures Procedures (including critical care  time)  Medications Ordered in UC Medications - No data to display  Initial Impression / Assessment and Plan / UC Course  I have reviewed the triage vital signs and the nursing notes.  Pertinent labs & imaging results that were available during my care of the patient were reviewed by me and considered in my medical decision making (see chart for details).  Dysuria  Urinalysis showed leukocytes, negative for nitrates, sent for culture empirically placed on Macrobid  she is symptomatic, recommended over-the-counter medications and nonpharmacological supportive care with follow-up with urgent care as needed Final Clinical Impressions(s) / UC Diagnoses   Final diagnoses:  Dysuria     Discharge Instructions      Your urinalysis shows Norbert Malkin blood cells but does not show bacteria your urine will be sent to the lab to determine exactly which bacteria is present, if any changes need to be made to your medications you will be notified  Begin use of macrobid  twice daily for 5 days  You may use over-the-counter Azo to help minimize your symptoms until antibiotic removes bacteria, this medication will turn your urine orange  Increase your fluid intake through use of water  As always practice good hygiene, wiping front to back and avoidance of scented vaginal products to prevent further irritation  If symptoms continue to persist after use of medication or recur please follow-up with urgent care or your primary doctor as needed    ED Prescriptions     Medication Sig Dispense Auth. Provider   nitrofurantoin , macrocrystal-monohydrate, (MACROBID ) 100 MG capsule Take 1 capsule (100 mg total) by mouth 2 (two) times daily. 10 capsule Crystalynn Mcinerney R, NP      PDMP not reviewed this encounter.   Teresa Shelba SAUNDERS, NP 08/11/24 1434

## 2024-08-13 LAB — URINE CULTURE: Culture: >=100000 — AB

## 2024-08-14 ENCOUNTER — Ambulatory Visit: Payer: Self-pay

## 2024-09-05 ENCOUNTER — Ambulatory Visit: Admitting: Family Medicine

## 2024-09-21 ENCOUNTER — Encounter: Payer: Self-pay | Admitting: Family Medicine

## 2024-09-25 MED ORDER — LO LOESTRIN FE 1 MG-10 MCG / 10 MCG PO TABS: 1.0000 | ORAL_TABLET | Freq: Every day | ORAL | 11 refills | Status: AC

## 2024-09-29 ENCOUNTER — Ambulatory Visit: Admitting: Family Medicine

## 2024-09-29 NOTE — Progress Notes (Deleted)
      Established patient visit   Patient: Tonya Cook   DOB: 01-29-2006   18 y.o. Female  MRN: [id number] Visit Date: 09/29/2024  Today's healthcare provider: Rockie Agent, MD   No chief complaint on file.  Subjective       Discussed the use of AI scribe software for clinical note transcription with the patient, who gave verbal consent to proceed.  History of Present Illness      Past Medical History:  Diagnosis Date   Asthma    Iliotibial band syndrome 07/29/2015    Medications: Outpatient Medications Prior to Visit  Medication Sig   albuterol (VENTOLIN HFA) 108 (90 Base) MCG/ACT inhaler Inhale 2 puffs into the lungs.   Albuterol-Budesonide (AIRSUPRA ) 90-80 MCG/ACT AERO Inhale 2 puffs into the lungs as needed.   hydrOXYzine  (VISTARIL ) 25 MG capsule Take 1 capsule (25 mg total) by mouth at bedtime as needed.   nitrofurantoin , macrocrystal-monohydrate, (MACROBID ) 100 MG capsule Take 1 capsule (100 mg total) by mouth 2 (two) times daily.   Norethindrone-Ethinyl Estradiol-Fe Biphas (LO LOESTRIN FE) 1 MG-10 MCG / 10 MCG tablet Take 1 tablet by mouth daily.   No facility-administered medications prior to visit.    Review of Systems  {Insert previous labs (optional):23779} {See past labs  Heme  Chem  Endocrine  Serology  Results Review (optional):1}   Objective    There were no vitals taken for this visit. {Insert last BP/Wt (optional):23777}{See vitals history (optional):1}    Physical Exam  ***  No results found for any visits on 09/29/24.  Assessment & Plan     Problem List Items Addressed This Visit   None   Assessment and Plan Assessment & Plan      No follow-ups on file.         Rockie Agent, MD  [facility] [phone] (phone) [phone] (fax)  [facility]
